# Patient Record
Sex: Female | Born: 1965 | Race: White | Hispanic: No | Smoking: Current every day smoker
Health system: Southern US, Community
[De-identification: ages and names within clinical notes are randomized; demographics above are authoritative.]

## PROBLEM LIST (undated history)

## (undated) DIAGNOSIS — I10 Essential (primary) hypertension: Secondary | ICD-10-CM

---

## 1999-02-27 ENCOUNTER — Encounter: Payer: Self-pay | Admitting: Internal Medicine

## 1999-02-27 ENCOUNTER — Ambulatory Visit (HOSPITAL_COMMUNITY): Admission: RE | Admit: 1999-02-27 | Discharge: 1999-02-27 | Payer: Self-pay | Admitting: Internal Medicine

## 1999-03-16 ENCOUNTER — Other Ambulatory Visit: Admission: RE | Admit: 1999-03-16 | Discharge: 1999-03-16 | Payer: Self-pay | Admitting: *Deleted

## 1999-06-15 ENCOUNTER — Other Ambulatory Visit: Admission: RE | Admit: 1999-06-15 | Discharge: 1999-06-15 | Payer: Self-pay | Admitting: *Deleted

## 1999-06-15 ENCOUNTER — Encounter (INDEPENDENT_AMBULATORY_CARE_PROVIDER_SITE_OTHER): Payer: Self-pay | Admitting: Specialist

## 1999-06-21 ENCOUNTER — Encounter: Admission: RE | Admit: 1999-06-21 | Discharge: 1999-06-21 | Payer: Self-pay | Admitting: Obstetrics and Gynecology

## 1999-06-21 ENCOUNTER — Encounter: Payer: Self-pay | Admitting: Obstetrics and Gynecology

## 1999-08-25 ENCOUNTER — Other Ambulatory Visit: Admission: RE | Admit: 1999-08-25 | Discharge: 1999-08-25 | Payer: Self-pay | Admitting: Obstetrics and Gynecology

## 1999-08-25 ENCOUNTER — Encounter (INDEPENDENT_AMBULATORY_CARE_PROVIDER_SITE_OTHER): Payer: Self-pay | Admitting: Specialist

## 2000-07-26 ENCOUNTER — Other Ambulatory Visit: Admission: RE | Admit: 2000-07-26 | Discharge: 2000-07-26 | Payer: Self-pay | Admitting: Family Medicine

## 2004-01-12 ENCOUNTER — Other Ambulatory Visit: Admission: RE | Admit: 2004-01-12 | Discharge: 2004-01-12 | Payer: Self-pay | Admitting: Family Medicine

## 2007-10-14 ENCOUNTER — Encounter: Admission: RE | Admit: 2007-10-14 | Discharge: 2007-10-14 | Payer: Self-pay | Admitting: Family Medicine

## 2010-03-24 ENCOUNTER — Other Ambulatory Visit: Payer: Self-pay | Admitting: Family Medicine

## 2010-03-24 DIAGNOSIS — Z1231 Encounter for screening mammogram for malignant neoplasm of breast: Secondary | ICD-10-CM

## 2010-03-29 ENCOUNTER — Ambulatory Visit: Payer: Self-pay

## 2010-04-05 ENCOUNTER — Ambulatory Visit
Admission: RE | Admit: 2010-04-05 | Discharge: 2010-04-05 | Disposition: A | Discharge status: Home / Self Care | Payer: BC Managed Care – PPO | Source: Ambulatory Visit | Attending: Family Medicine | Admitting: Family Medicine

## 2010-04-05 DIAGNOSIS — Z1231 Encounter for screening mammogram for malignant neoplasm of breast: Secondary | ICD-10-CM

## 2011-10-30 ENCOUNTER — Other Ambulatory Visit: Payer: Self-pay | Admitting: Family Medicine

## 2011-10-30 DIAGNOSIS — Z1231 Encounter for screening mammogram for malignant neoplasm of breast: Secondary | ICD-10-CM

## 2011-11-27 ENCOUNTER — Ambulatory Visit: Payer: BC Managed Care – PPO

## 2011-12-31 ENCOUNTER — Ambulatory Visit: Payer: BC Managed Care – PPO

## 2012-02-08 ENCOUNTER — Ambulatory Visit: Payer: BC Managed Care – PPO

## 2012-02-18 ENCOUNTER — Other Ambulatory Visit: Payer: Self-pay | Admitting: Family Medicine

## 2012-02-18 DIAGNOSIS — N921 Excessive and frequent menstruation with irregular cycle: Secondary | ICD-10-CM

## 2012-02-20 ENCOUNTER — Ambulatory Visit
Admission: RE | Admit: 2012-02-20 | Discharge: 2012-02-20 | Disposition: A | Discharge status: Home / Self Care | Payer: BC Managed Care – PPO | Source: Ambulatory Visit | Attending: Family Medicine | Admitting: Family Medicine

## 2012-02-20 DIAGNOSIS — N921 Excessive and frequent menstruation with irregular cycle: Secondary | ICD-10-CM

## 2012-05-14 ENCOUNTER — Ambulatory Visit: Payer: BC Managed Care – PPO

## 2012-05-21 ENCOUNTER — Ambulatory Visit: Payer: BC Managed Care – PPO

## 2013-04-21 ENCOUNTER — Other Ambulatory Visit: Payer: Self-pay | Admitting: Family Medicine

## 2013-04-21 DIAGNOSIS — Z1231 Encounter for screening mammogram for malignant neoplasm of breast: Secondary | ICD-10-CM

## 2013-05-01 ENCOUNTER — Ambulatory Visit
Admission: RE | Admit: 2013-05-01 | Discharge: 2013-05-01 | Disposition: A | Discharge status: Home / Self Care | Payer: BC Managed Care – PPO | Source: Ambulatory Visit | Attending: Family Medicine | Admitting: Family Medicine

## 2013-05-01 DIAGNOSIS — Z1231 Encounter for screening mammogram for malignant neoplasm of breast: Secondary | ICD-10-CM

## 2014-04-16 ENCOUNTER — Other Ambulatory Visit: Payer: Self-pay

## 2014-04-16 DIAGNOSIS — Z1231 Encounter for screening mammogram for malignant neoplasm of breast: Secondary | ICD-10-CM

## 2014-05-10 ENCOUNTER — Ambulatory Visit
Admission: RE | Admit: 2014-05-10 | Discharge: 2014-05-10 | Disposition: A | Discharge status: Home / Self Care | Payer: Self-pay | Source: Ambulatory Visit

## 2014-05-10 DIAGNOSIS — Z1231 Encounter for screening mammogram for malignant neoplasm of breast: Secondary | ICD-10-CM

## 2014-05-12 ENCOUNTER — Ambulatory Visit: Admission: RE | Admit: 2014-05-12 | Payer: BLUE CROSS/BLUE SHIELD | Source: Ambulatory Visit

## 2014-05-12 ENCOUNTER — Ambulatory Visit
Admission: RE | Admit: 2014-05-12 | Discharge: 2014-05-12 | Disposition: A | Discharge status: Home / Self Care | Payer: BLUE CROSS/BLUE SHIELD | Source: Ambulatory Visit

## 2014-05-12 ENCOUNTER — Other Ambulatory Visit: Payer: Self-pay

## 2014-05-12 DIAGNOSIS — Z1231 Encounter for screening mammogram for malignant neoplasm of breast: Secondary | ICD-10-CM

## 2015-05-20 ENCOUNTER — Other Ambulatory Visit: Payer: Self-pay | Admitting: Family Medicine

## 2015-05-20 DIAGNOSIS — Z1231 Encounter for screening mammogram for malignant neoplasm of breast: Secondary | ICD-10-CM

## 2015-05-20 DIAGNOSIS — E781 Pure hyperglyceridemia: Secondary | ICD-10-CM | POA: Diagnosis not present

## 2015-05-20 DIAGNOSIS — F17200 Nicotine dependence, unspecified, uncomplicated: Secondary | ICD-10-CM | POA: Diagnosis not present

## 2015-05-20 DIAGNOSIS — Z1389 Encounter for screening for other disorder: Secondary | ICD-10-CM | POA: Diagnosis not present

## 2015-05-20 DIAGNOSIS — E669 Obesity, unspecified: Secondary | ICD-10-CM | POA: Diagnosis not present

## 2015-05-20 DIAGNOSIS — Z0000 Encounter for general adult medical examination without abnormal findings: Secondary | ICD-10-CM | POA: Diagnosis not present

## 2015-05-20 DIAGNOSIS — Z79899 Other long term (current) drug therapy: Secondary | ICD-10-CM | POA: Diagnosis not present

## 2015-05-20 DIAGNOSIS — R5383 Other fatigue: Secondary | ICD-10-CM | POA: Diagnosis not present

## 2015-06-01 ENCOUNTER — Ambulatory Visit
Admission: RE | Admit: 2015-06-01 | Discharge: 2015-06-01 | Disposition: A | Discharge status: Home / Self Care | Payer: BLUE CROSS/BLUE SHIELD | Source: Ambulatory Visit | Attending: Family Medicine | Admitting: Family Medicine

## 2015-06-01 DIAGNOSIS — Z1231 Encounter for screening mammogram for malignant neoplasm of breast: Secondary | ICD-10-CM

## 2015-06-06 ENCOUNTER — Other Ambulatory Visit: Payer: Self-pay | Admitting: Family Medicine

## 2015-06-06 DIAGNOSIS — R928 Other abnormal and inconclusive findings on diagnostic imaging of breast: Secondary | ICD-10-CM

## 2015-06-10 ENCOUNTER — Ambulatory Visit
Admission: RE | Admit: 2015-06-10 | Discharge: 2015-06-10 | Disposition: A | Discharge status: Home / Self Care | Payer: BLUE CROSS/BLUE SHIELD | Source: Ambulatory Visit | Attending: Family Medicine | Admitting: Family Medicine

## 2015-06-10 DIAGNOSIS — R921 Mammographic calcification found on diagnostic imaging of breast: Secondary | ICD-10-CM | POA: Diagnosis not present

## 2015-06-10 DIAGNOSIS — R928 Other abnormal and inconclusive findings on diagnostic imaging of breast: Secondary | ICD-10-CM

## 2015-06-30 DIAGNOSIS — E669 Obesity, unspecified: Secondary | ICD-10-CM | POA: Diagnosis not present

## 2015-06-30 DIAGNOSIS — R21 Rash and other nonspecific skin eruption: Secondary | ICD-10-CM | POA: Diagnosis not present

## 2015-06-30 DIAGNOSIS — T781XXA Other adverse food reactions, not elsewhere classified, initial encounter: Secondary | ICD-10-CM | POA: Diagnosis not present

## 2015-06-30 DIAGNOSIS — Z6832 Body mass index (BMI) 32.0-32.9, adult: Secondary | ICD-10-CM | POA: Diagnosis not present

## 2015-11-21 DIAGNOSIS — F419 Anxiety disorder, unspecified: Secondary | ICD-10-CM | POA: Diagnosis not present

## 2015-11-21 DIAGNOSIS — Z23 Encounter for immunization: Secondary | ICD-10-CM | POA: Diagnosis not present

## 2015-11-21 DIAGNOSIS — K219 Gastro-esophageal reflux disease without esophagitis: Secondary | ICD-10-CM | POA: Diagnosis not present

## 2015-11-21 DIAGNOSIS — R419 Unspecified symptoms and signs involving cognitive functions and awareness: Secondary | ICD-10-CM | POA: Diagnosis not present

## 2015-11-21 DIAGNOSIS — I10 Essential (primary) hypertension: Secondary | ICD-10-CM | POA: Diagnosis not present

## 2015-11-21 DIAGNOSIS — J309 Allergic rhinitis, unspecified: Secondary | ICD-10-CM | POA: Diagnosis not present

## 2016-04-24 DIAGNOSIS — Z6831 Body mass index (BMI) 31.0-31.9, adult: Secondary | ICD-10-CM | POA: Diagnosis not present

## 2016-04-24 DIAGNOSIS — E669 Obesity, unspecified: Secondary | ICD-10-CM | POA: Diagnosis not present

## 2016-04-24 DIAGNOSIS — J019 Acute sinusitis, unspecified: Secondary | ICD-10-CM | POA: Diagnosis not present

## 2016-05-24 DIAGNOSIS — Z720 Tobacco use: Secondary | ICD-10-CM | POA: Diagnosis not present

## 2016-05-24 DIAGNOSIS — K219 Gastro-esophageal reflux disease without esophagitis: Secondary | ICD-10-CM | POA: Diagnosis not present

## 2016-05-24 DIAGNOSIS — E669 Obesity, unspecified: Secondary | ICD-10-CM | POA: Diagnosis not present

## 2016-05-24 DIAGNOSIS — F419 Anxiety disorder, unspecified: Secondary | ICD-10-CM | POA: Diagnosis not present

## 2016-05-24 DIAGNOSIS — Z1389 Encounter for screening for other disorder: Secondary | ICD-10-CM | POA: Diagnosis not present

## 2016-05-24 DIAGNOSIS — E781 Pure hyperglyceridemia: Secondary | ICD-10-CM | POA: Diagnosis not present

## 2016-05-24 DIAGNOSIS — I10 Essential (primary) hypertension: Secondary | ICD-10-CM | POA: Diagnosis not present

## 2016-07-17 DIAGNOSIS — Z6831 Body mass index (BMI) 31.0-31.9, adult: Secondary | ICD-10-CM | POA: Diagnosis not present

## 2016-07-17 DIAGNOSIS — J329 Chronic sinusitis, unspecified: Secondary | ICD-10-CM | POA: Diagnosis not present

## 2016-10-02 DIAGNOSIS — Z6831 Body mass index (BMI) 31.0-31.9, adult: Secondary | ICD-10-CM | POA: Diagnosis not present

## 2016-10-02 DIAGNOSIS — J019 Acute sinusitis, unspecified: Secondary | ICD-10-CM | POA: Diagnosis not present

## 2017-02-19 DIAGNOSIS — Z0000 Encounter for general adult medical examination without abnormal findings: Secondary | ICD-10-CM | POA: Diagnosis not present

## 2017-02-19 DIAGNOSIS — Z6832 Body mass index (BMI) 32.0-32.9, adult: Secondary | ICD-10-CM | POA: Diagnosis not present

## 2017-02-19 DIAGNOSIS — Z1231 Encounter for screening mammogram for malignant neoplasm of breast: Secondary | ICD-10-CM | POA: Diagnosis not present

## 2017-02-21 ENCOUNTER — Other Ambulatory Visit: Payer: Self-pay | Admitting: Nurse Practitioner

## 2017-02-21 DIAGNOSIS — R921 Mammographic calcification found on diagnostic imaging of breast: Secondary | ICD-10-CM

## 2017-03-13 ENCOUNTER — Ambulatory Visit
Admission: RE | Admit: 2017-03-13 | Discharge: 2017-03-13 | Disposition: A | Discharge status: Home / Self Care | Payer: BLUE CROSS/BLUE SHIELD | Source: Ambulatory Visit | Attending: Nurse Practitioner | Admitting: Nurse Practitioner

## 2017-03-13 DIAGNOSIS — R921 Mammographic calcification found on diagnostic imaging of breast: Secondary | ICD-10-CM

## 2017-05-22 ENCOUNTER — Other Ambulatory Visit: Payer: Self-pay

## 2017-05-22 ENCOUNTER — Emergency Department (HOSPITAL_BASED_OUTPATIENT_CLINIC_OR_DEPARTMENT_OTHER)
Admission: EM | Admit: 2017-05-22 | Discharge: 2017-05-22 | Disposition: A | Discharge status: Home / Self Care | Payer: BLUE CROSS/BLUE SHIELD | Attending: Emergency Medicine | Admitting: Emergency Medicine

## 2017-05-22 ENCOUNTER — Emergency Department (HOSPITAL_BASED_OUTPATIENT_CLINIC_OR_DEPARTMENT_OTHER): Payer: BLUE CROSS/BLUE SHIELD

## 2017-05-22 ENCOUNTER — Encounter (HOSPITAL_BASED_OUTPATIENT_CLINIC_OR_DEPARTMENT_OTHER): Payer: Self-pay | Admitting: Emergency Medicine

## 2017-05-22 DIAGNOSIS — Y92019 Unspecified place in single-family (private) house as the place of occurrence of the external cause: Secondary | ICD-10-CM | POA: Diagnosis not present

## 2017-05-22 DIAGNOSIS — S61411A Laceration without foreign body of right hand, initial encounter: Secondary | ICD-10-CM | POA: Insufficient documentation

## 2017-05-22 DIAGNOSIS — Z79899 Other long term (current) drug therapy: Secondary | ICD-10-CM | POA: Insufficient documentation

## 2017-05-22 DIAGNOSIS — I10 Essential (primary) hypertension: Secondary | ICD-10-CM | POA: Diagnosis not present

## 2017-05-22 DIAGNOSIS — S61212A Laceration without foreign body of right middle finger without damage to nail, initial encounter: Secondary | ICD-10-CM | POA: Diagnosis not present

## 2017-05-22 DIAGNOSIS — S61451A Open bite of right hand, initial encounter: Secondary | ICD-10-CM | POA: Diagnosis not present

## 2017-05-22 DIAGNOSIS — Y999 Unspecified external cause status: Secondary | ICD-10-CM | POA: Insufficient documentation

## 2017-05-22 DIAGNOSIS — W540XXA Bitten by dog, initial encounter: Secondary | ICD-10-CM | POA: Insufficient documentation

## 2017-05-22 DIAGNOSIS — Y939 Activity, unspecified: Secondary | ICD-10-CM | POA: Diagnosis not present

## 2017-05-22 DIAGNOSIS — F17200 Nicotine dependence, unspecified, uncomplicated: Secondary | ICD-10-CM | POA: Diagnosis not present

## 2017-05-22 DIAGNOSIS — S6991XA Unspecified injury of right wrist, hand and finger(s), initial encounter: Secondary | ICD-10-CM | POA: Diagnosis not present

## 2017-05-22 DIAGNOSIS — S61214A Laceration without foreign body of right ring finger without damage to nail, initial encounter: Secondary | ICD-10-CM | POA: Diagnosis not present

## 2017-05-22 HISTORY — DX: Essential (primary) hypertension: I10

## 2017-05-22 MED ORDER — LIDOCAINE HCL 2 % IJ SOLN
20.0000 mL | Freq: Once | INTRAMUSCULAR | Status: AC
  Administered 2017-05-22: 400 mg via INTRADERMAL
  Filled 2017-05-22: qty 20

## 2017-05-22 MED ORDER — TETANUS-DIPHTH-ACELL PERTUSSIS 5-2.5-18.5 LF-MCG/0.5 IM SUSP
0.5000 mL | Freq: Once | INTRAMUSCULAR | Status: AC
  Administered 2017-05-22: 0.5 mL via INTRAMUSCULAR
  Filled 2017-05-22: qty 0.5

## 2017-05-22 MED ORDER — AMOXICILLIN-POT CLAVULANATE 875-125 MG PO TABS
1.0000 | ORAL_TABLET | Freq: Once | ORAL | Status: AC
  Administered 2017-05-22: 1 via ORAL
  Filled 2017-05-22: qty 1

## 2017-05-22 MED ORDER — AMOXICILLIN-POT CLAVULANATE 875-125 MG PO TABS: 1.0000 | ORAL_TABLET | Freq: Two times a day (BID) | ORAL | 0 refills | Status: AC

## 2017-05-22 MED FILL — AMOX-CLAV 875-125 MG TABLET: 875-125 | 5 days supply | Qty: 10 | Fill #0

## 2017-05-22 NOTE — ED Notes (Signed)
ED Provider at bedside. 

## 2017-05-22 NOTE — Discharge Instructions (Signed)
Keep area clean and covered Take Augmentin twice daily with food for 5 days Return if you have worsening pain, swelling, drainage from the area

## 2017-05-22 NOTE — ED Provider Notes (Signed)
[facility] EMERGENCY DEPARTMENT Provider Note   CSN: [id number] Arrival date & time: 05/22/17  1003     History   Chief Complaint Chief Complaint  Patient presents with  . Animal Bite    HPI Heidi Wright is a 52 y.o. female who presents with right hand laceration.  Past medical history significant for hypertension.  She states that she was breaking up a fight between 2 of her dogs and they bit her right hand.  She sustained a puncture wound on the dorsal aspect of her hand and an abrasion on her finger.  It occurred at about 4 AM this morning.  She has full range of motion of her finger but to to swelling it is mildly decreased.  She did clean out the wound with soap and water.  She is not up-to-date on tetanus.  Her dogs are up-to-date on rabies vaccination.  HPI  Past Medical History:  Diagnosis Date  . Hypertension     There are no active problems to display for this patient.   History reviewed. No pertinent surgical history.   OB History   None      Home Medications    Prior to Admission medications   Medication Sig Start Date End Date Taking? Authorizing Provider  amLODipine (NORVASC) 10 MG tablet Take 10 mg by mouth daily.   Yes [provider]  busPIRone (BUSPAR) 10 MG tablet Take 10 mg by mouth 3 (three) times daily.   Yes [provider]  doxazosin (CARDURA) 2 MG tablet Take 2 mg by mouth daily.   Yes [provider]  DULoxetine (CYMBALTA) 20 MG capsule Take 20 mg by mouth daily.   Yes [provider]  omeprazole (PRILOSEC) 20 MG capsule Take 20 mg by mouth daily.   Yes [provider]  amoxicillin-clavulanate (AUGMENTIN) 875-125 MG tablet Take 1 tablet by mouth 2 (two) times daily. 05/22/17   Bethel Born, PA-C    Family History No family history on file.  Social History Social History   Tobacco Use  . Smoking status: Current Every Day Smoker  . Smokeless tobacco: Never Used  Substance  Use Topics  . Alcohol use: Not on file  . Drug use: Not on file     Allergies   Sulfa antibiotics   Review of Systems Review of Systems  Musculoskeletal: Positive for arthralgias.  Skin: Positive for wound.  Neurological: Negative for weakness and numbness.  All other systems reviewed and are negative.    Physical Exam Updated Vital Signs BP (!) 153/90 (BP Location: Right Arm)   Pulse 84   Temp 98.9 F (37.2 C) (Oral)   Resp 16   Ht  (1.6 m)   Wt 77.1 kg (170 lb)   SpO2 99%   BMI 30.11 kg/m   Physical Exam  Constitutional: She is oriented to person, place, and time. She appears well-developed and well-nourished. No distress.  Calm and cooperative  HENT:  Head: Normocephalic and atraumatic.  Eyes: Pupils are equal, round, and reactive to light. Conjunctivae are normal. Right eye exhibits no discharge. Left eye exhibits no discharge. No scleral icterus.  Neck: Normal range of motion.  Cardiovascular: Normal rate.  Pulmonary/Chest: Effort normal. No respiratory distress.  Abdominal: She exhibits no distension.  Musculoskeletal:  Right hand: Mild swelling over dorsal aspect of hand. Laceration between the 2nd and 3rd finger web space with tenderness. No obvious tendon laceration of bone. Decreased ROM. N/V intact.  Neurological: She is alert and oriented to person, place, and time.  Skin: Skin is warm and dry.  Psychiatric: She has a normal mood and affect. Her behavior is normal.  Nursing note and vitals reviewed.    ED Treatments / Results  Labs (all labs ordered are listed, but only abnormal results are displayed) Labs Reviewed - No data to display  EKG None  Radiology Dg Hand Complete Right  Result Date: 05/22/2017 CLINICAL DATA:  Deep laceration.  Dog bite. EXAM: RIGHT HAND - COMPLETE 3+ VIEW COMPARISON:  None. FINDINGS: There is no evidence of fracture or dislocation. There is no evidence of arthropathy or other focal bone abnormality. Mild soft  tissue swelling. IMPRESSION: Negative for fracture or dislocation. Electronically Signed   By: Elsie Stain M.D.   On: 05/22/2017 14:37    Procedures Procedures (including critical care time)  Medications Ordered in ED Medications  Tdap (BOOSTRIX) injection 0.5 mL (has no administration in time range)  amoxicillin-clavulanate (AUGMENTIN) 875-125 MG per tablet 1 tablet (has no administration in time range)  lidocaine (XYLOCAINE) 2 % (with pres) injection 400 mg (400 mg Intradermal Given 05/22/17 1415)     Initial Impression / Assessment and Plan / ED Course  I have reviewed the triage vital signs and the nursing notes.  Pertinent labs & imaging results that were available during my care of the patient were reviewed by me and considered in my medical decision making (see chart for details).  52 year old female with puncture wound to the right hand after trying to break up a dog fight.  She is mildly hypertensive otherwise vital signs are normal.  Tetanus was updated.  Discussed with her that we will let the wound to heal by secondary intention since she is at high risk for getting an infection in the area.  X-ray was negative.  The wound was copiously irrigated and she was given strict return precautions.  She is also given a prescription for Augmentin for 5 days.  Final Clinical Impressions(s) / ED Diagnoses   Final diagnoses:  Dog bite of right hand, initial encounter    ED Discharge Orders        Ordered    amoxicillin-clavulanate (AUGMENTIN) 875-125 MG tablet  2 times daily     05/22/17 1515       Bethel Born, PA-C 05/22/17 1522    Loren Racer, MD 05/23/17 [phone]

## 2017-05-22 NOTE — ED Triage Notes (Signed)
Lacerations to R hand from breaking up a fight between her two dogs. Vaccines are up to date.

## 2017-07-02 DIAGNOSIS — M545 Low back pain: Secondary | ICD-10-CM | POA: Diagnosis not present

## 2017-07-02 DIAGNOSIS — Z6832 Body mass index (BMI) 32.0-32.9, adult: Secondary | ICD-10-CM | POA: Diagnosis not present

## 2017-11-08 DIAGNOSIS — F419 Anxiety disorder, unspecified: Secondary | ICD-10-CM | POA: Diagnosis not present

## 2017-11-08 DIAGNOSIS — Z720 Tobacco use: Secondary | ICD-10-CM | POA: Diagnosis not present

## 2017-11-08 DIAGNOSIS — I10 Essential (primary) hypertension: Secondary | ICD-10-CM | POA: Diagnosis not present

## 2017-11-08 DIAGNOSIS — E781 Pure hyperglyceridemia: Secondary | ICD-10-CM | POA: Diagnosis not present

## 2017-11-08 DIAGNOSIS — E669 Obesity, unspecified: Secondary | ICD-10-CM | POA: Diagnosis not present

## 2017-11-08 DIAGNOSIS — Z23 Encounter for immunization: Secondary | ICD-10-CM | POA: Diagnosis not present

## 2017-11-08 DIAGNOSIS — K219 Gastro-esophageal reflux disease without esophagitis: Secondary | ICD-10-CM | POA: Diagnosis not present

## 2017-11-18 DIAGNOSIS — J329 Chronic sinusitis, unspecified: Secondary | ICD-10-CM | POA: Diagnosis not present

## 2018-02-10 DIAGNOSIS — J069 Acute upper respiratory infection, unspecified: Secondary | ICD-10-CM | POA: Diagnosis not present

## 2018-02-10 DIAGNOSIS — Z6832 Body mass index (BMI) 32.0-32.9, adult: Secondary | ICD-10-CM | POA: Diagnosis not present

## 2018-03-13 DIAGNOSIS — R05 Cough: Secondary | ICD-10-CM | POA: Diagnosis not present

## 2018-03-13 DIAGNOSIS — J00 Acute nasopharyngitis [common cold]: Secondary | ICD-10-CM | POA: Diagnosis not present

## 2018-03-13 DIAGNOSIS — R0981 Nasal congestion: Secondary | ICD-10-CM | POA: Diagnosis not present

## 2018-05-19 DIAGNOSIS — J0100 Acute maxillary sinusitis, unspecified: Secondary | ICD-10-CM | POA: Diagnosis not present

## 2018-05-19 DIAGNOSIS — Z6831 Body mass index (BMI) 31.0-31.9, adult: Secondary | ICD-10-CM | POA: Diagnosis not present

## 2018-09-01 DIAGNOSIS — E669 Obesity, unspecified: Secondary | ICD-10-CM | POA: Diagnosis not present

## 2018-09-01 DIAGNOSIS — Z720 Tobacco use: Secondary | ICD-10-CM | POA: Diagnosis not present

## 2018-09-01 DIAGNOSIS — E781 Pure hyperglyceridemia: Secondary | ICD-10-CM | POA: Diagnosis not present

## 2018-09-01 DIAGNOSIS — I10 Essential (primary) hypertension: Secondary | ICD-10-CM | POA: Diagnosis not present

## 2018-09-01 DIAGNOSIS — K219 Gastro-esophageal reflux disease without esophagitis: Secondary | ICD-10-CM | POA: Diagnosis not present

## 2018-09-01 DIAGNOSIS — F419 Anxiety disorder, unspecified: Secondary | ICD-10-CM | POA: Diagnosis not present

## 2018-10-20 DIAGNOSIS — A084 Viral intestinal infection, unspecified: Secondary | ICD-10-CM | POA: Diagnosis not present

## 2018-12-09 DIAGNOSIS — Z6831 Body mass index (BMI) 31.0-31.9, adult: Secondary | ICD-10-CM | POA: Diagnosis not present

## 2018-12-09 DIAGNOSIS — K5900 Constipation, unspecified: Secondary | ICD-10-CM | POA: Diagnosis not present

## 2018-12-09 DIAGNOSIS — R109 Unspecified abdominal pain: Secondary | ICD-10-CM | POA: Diagnosis not present

## 2018-12-09 DIAGNOSIS — R140 Abdominal distension (gaseous): Secondary | ICD-10-CM | POA: Diagnosis not present

## 2019-02-23 DIAGNOSIS — R430 Anosmia: Secondary | ICD-10-CM | POA: Diagnosis not present

## 2019-02-23 DIAGNOSIS — J3489 Other specified disorders of nose and nasal sinuses: Secondary | ICD-10-CM | POA: Diagnosis not present

## 2019-02-23 DIAGNOSIS — Z20828 Contact with and (suspected) exposure to other viral communicable diseases: Secondary | ICD-10-CM | POA: Diagnosis not present

## 2019-02-23 DIAGNOSIS — R438 Other disturbances of smell and taste: Secondary | ICD-10-CM | POA: Diagnosis not present

## 2019-04-01 DIAGNOSIS — Z2821 Immunization not carried out because of patient refusal: Secondary | ICD-10-CM | POA: Diagnosis not present

## 2019-04-01 DIAGNOSIS — Z1231 Encounter for screening mammogram for malignant neoplasm of breast: Secondary | ICD-10-CM | POA: Diagnosis not present

## 2019-04-01 DIAGNOSIS — Z0000 Encounter for general adult medical examination without abnormal findings: Secondary | ICD-10-CM | POA: Diagnosis not present

## 2019-04-01 DIAGNOSIS — Z13220 Encounter for screening for lipoid disorders: Secondary | ICD-10-CM | POA: Diagnosis not present

## 2019-04-01 DIAGNOSIS — Z6829 Body mass index (BMI) 29.0-29.9, adult: Secondary | ICD-10-CM | POA: Diagnosis not present

## 2019-04-02 ENCOUNTER — Other Ambulatory Visit: Payer: Self-pay | Admitting: Nurse Practitioner

## 2019-04-02 DIAGNOSIS — Z1231 Encounter for screening mammogram for malignant neoplasm of breast: Secondary | ICD-10-CM

## 2019-04-20 ENCOUNTER — Other Ambulatory Visit: Payer: Self-pay

## 2019-04-20 ENCOUNTER — Ambulatory Visit
Admission: RE | Admit: 2019-04-20 | Discharge: 2019-04-20 | Disposition: A | Discharge status: Home / Self Care | Payer: BLUE CROSS/BLUE SHIELD | Source: Ambulatory Visit | Attending: Nurse Practitioner | Admitting: Nurse Practitioner

## 2019-04-20 DIAGNOSIS — Z1231 Encounter for screening mammogram for malignant neoplasm of breast: Secondary | ICD-10-CM | POA: Diagnosis not present

## 2019-06-17 DIAGNOSIS — M9901 Segmental and somatic dysfunction of cervical region: Secondary | ICD-10-CM | POA: Diagnosis not present

## 2019-06-17 DIAGNOSIS — M9903 Segmental and somatic dysfunction of lumbar region: Secondary | ICD-10-CM | POA: Diagnosis not present

## 2019-06-17 DIAGNOSIS — M545 Low back pain: Secondary | ICD-10-CM | POA: Diagnosis not present

## 2019-06-17 DIAGNOSIS — M62830 Muscle spasm of back: Secondary | ICD-10-CM | POA: Diagnosis not present

## 2019-06-23 DIAGNOSIS — M9901 Segmental and somatic dysfunction of cervical region: Secondary | ICD-10-CM | POA: Diagnosis not present

## 2019-06-23 DIAGNOSIS — M545 Low back pain: Secondary | ICD-10-CM | POA: Diagnosis not present

## 2019-06-23 DIAGNOSIS — M9903 Segmental and somatic dysfunction of lumbar region: Secondary | ICD-10-CM | POA: Diagnosis not present

## 2019-06-23 DIAGNOSIS — M62830 Muscle spasm of back: Secondary | ICD-10-CM | POA: Diagnosis not present

## 2019-08-17 DIAGNOSIS — Z20822 Contact with and (suspected) exposure to covid-19: Secondary | ICD-10-CM | POA: Diagnosis not present

## 2019-08-21 DIAGNOSIS — Z20822 Contact with and (suspected) exposure to covid-19: Secondary | ICD-10-CM | POA: Diagnosis not present

## 2019-08-24 DIAGNOSIS — Z20822 Contact with and (suspected) exposure to covid-19: Secondary | ICD-10-CM | POA: Diagnosis not present

## 2019-08-31 DIAGNOSIS — Z20822 Contact with and (suspected) exposure to covid-19: Secondary | ICD-10-CM | POA: Diagnosis not present

## 2019-09-14 DIAGNOSIS — Z20822 Contact with and (suspected) exposure to covid-19: Secondary | ICD-10-CM | POA: Diagnosis not present

## 2019-09-28 DIAGNOSIS — Z1152 Encounter for screening for COVID-19: Secondary | ICD-10-CM | POA: Diagnosis not present

## 2019-10-10 DIAGNOSIS — Z6829 Body mass index (BMI) 29.0-29.9, adult: Secondary | ICD-10-CM | POA: Diagnosis not present

## 2019-10-10 DIAGNOSIS — M545 Low back pain: Secondary | ICD-10-CM | POA: Diagnosis not present

## 2019-10-30 DIAGNOSIS — R140 Abdominal distension (gaseous): Secondary | ICD-10-CM | POA: Diagnosis not present

## 2019-10-30 DIAGNOSIS — I10 Essential (primary) hypertension: Secondary | ICD-10-CM | POA: Diagnosis not present

## 2019-10-30 DIAGNOSIS — K219 Gastro-esophageal reflux disease without esophagitis: Secondary | ICD-10-CM | POA: Diagnosis not present

## 2019-10-30 DIAGNOSIS — Z720 Tobacco use: Secondary | ICD-10-CM | POA: Diagnosis not present

## 2019-10-30 DIAGNOSIS — F419 Anxiety disorder, unspecified: Secondary | ICD-10-CM | POA: Diagnosis not present

## 2019-10-30 DIAGNOSIS — Z1331 Encounter for screening for depression: Secondary | ICD-10-CM | POA: Diagnosis not present

## 2019-10-30 DIAGNOSIS — E669 Obesity, unspecified: Secondary | ICD-10-CM | POA: Diagnosis not present

## 2019-11-23 DIAGNOSIS — F17210 Nicotine dependence, cigarettes, uncomplicated: Secondary | ICD-10-CM | POA: Diagnosis not present

## 2019-11-23 DIAGNOSIS — J209 Acute bronchitis, unspecified: Secondary | ICD-10-CM | POA: Diagnosis not present

## 2019-11-23 DIAGNOSIS — J019 Acute sinusitis, unspecified: Secondary | ICD-10-CM | POA: Diagnosis not present

## 2020-03-03 ENCOUNTER — Other Ambulatory Visit: Payer: Self-pay | Admitting: Physician Assistant

## 2020-03-03 DIAGNOSIS — R1084 Generalized abdominal pain: Secondary | ICD-10-CM

## 2020-03-21 ENCOUNTER — Ambulatory Visit
Admission: RE | Admit: 2020-03-21 | Discharge: 2020-03-21 | Disposition: A | Discharge status: Home / Self Care | Payer: BC Managed Care – PPO | Source: Ambulatory Visit | Attending: Physician Assistant | Admitting: Physician Assistant

## 2020-03-21 ENCOUNTER — Other Ambulatory Visit: Payer: Self-pay

## 2020-03-21 DIAGNOSIS — R1084 Generalized abdominal pain: Secondary | ICD-10-CM

## 2020-03-21 MED ORDER — IOPAMIDOL (ISOVUE-300) INJECTION 61%
100.0000 mL | Freq: Once | INTRAVENOUS | Status: AC | PRN
  Administered 2020-03-21: 100 mL via INTRAVENOUS

## 2020-11-03 ENCOUNTER — Other Ambulatory Visit: Payer: Self-pay | Admitting: Physician Assistant

## 2020-11-03 DIAGNOSIS — Z1231 Encounter for screening mammogram for malignant neoplasm of breast: Secondary | ICD-10-CM

## 2020-12-20 ENCOUNTER — Ambulatory Visit: Payer: BC Managed Care – PPO

## 2020-12-27 ENCOUNTER — Ambulatory Visit
Admission: RE | Admit: 2020-12-27 | Discharge: 2020-12-27 | Disposition: A | Discharge status: Home / Self Care | Payer: BC Managed Care – PPO | Source: Ambulatory Visit | Attending: Physician Assistant | Admitting: Physician Assistant

## 2020-12-27 DIAGNOSIS — Z1231 Encounter for screening mammogram for malignant neoplasm of breast: Secondary | ICD-10-CM

## 2021-03-15 IMAGING — CT CT ABD-PELV W/ CM
1 of 3 series · 14 of 32 positions shown, 19 images · IV contrast (iopamidol)
Comparison: None.

CLINICAL DATA: Intermittent generalized abdominal pain for several
years.

EXAM:
CT ABDOMEN AND PELVIS WITH CONTRAST
TECHNIQUE: Multidetector CT imaging of the abdomen and pelvis was performed
using the standard protocol following bolus administration of
intravenous contrast.
CONTRAST:  100mL [id number] IOPAMIDOL ([id number]) INJECTION 61%

[Series 2: abd/pelvis w/cm · axial · 0.72mm/px · z∈[-449,-34]mm · 14 of 95 slices shown, 19 images]
[im 6/95  soft-tissue]
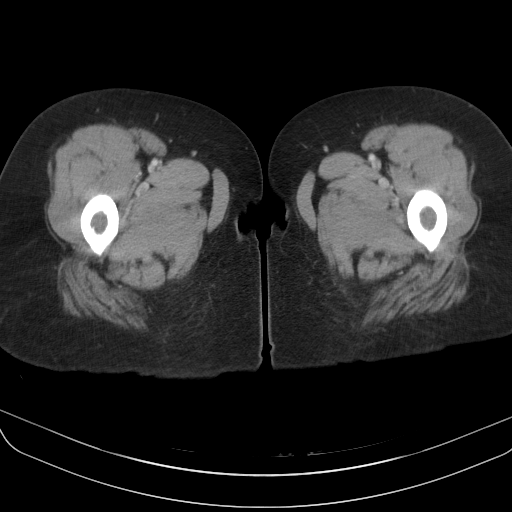
[im 6/95  bone]
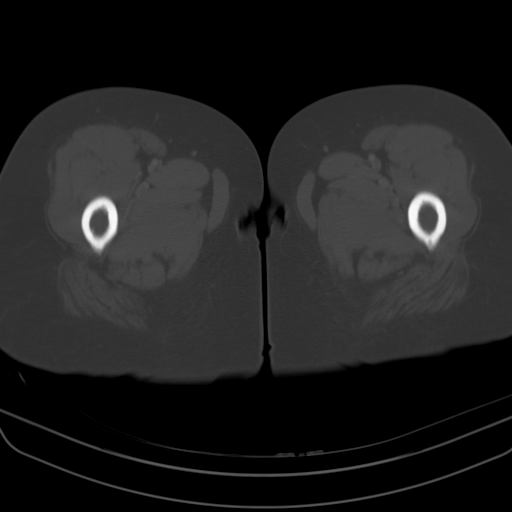
[im 12/95  soft-tissue]
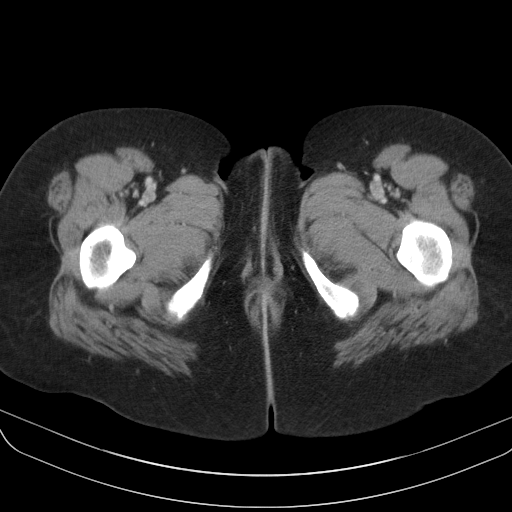
[im 23/95  soft-tissue]
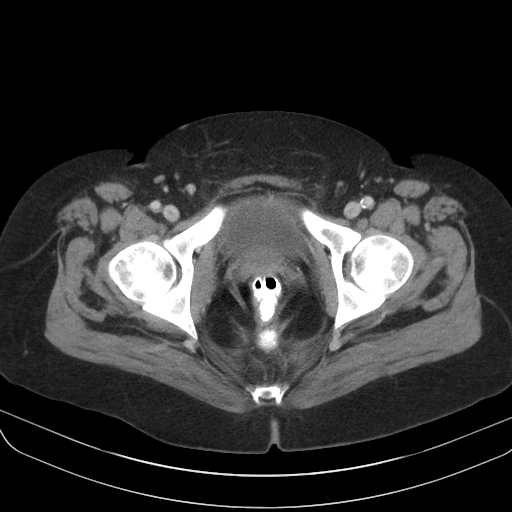
[im 28/95  soft-tissue]
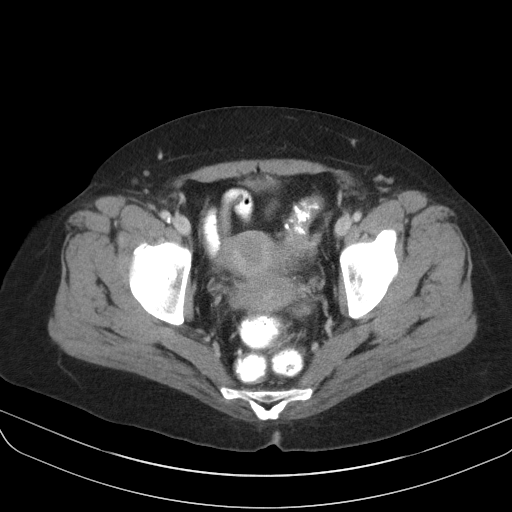
[im 34/95  soft-tissue]
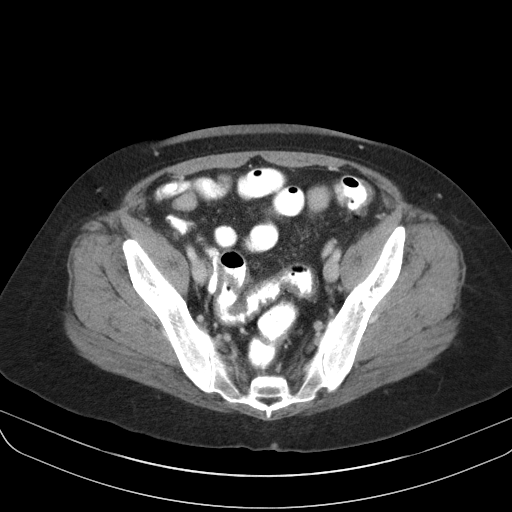
[im 39/95  soft-tissue]
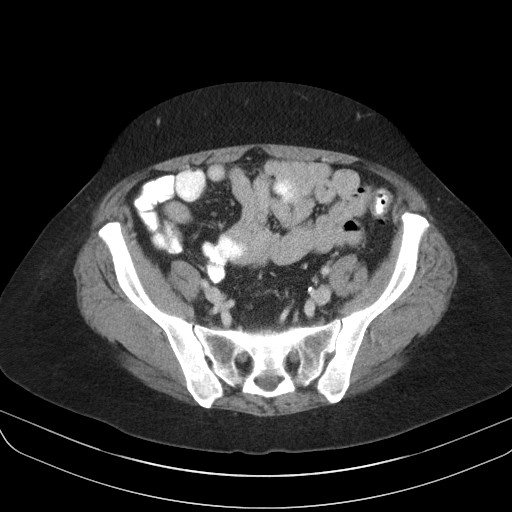
[im 50/95  soft-tissue]
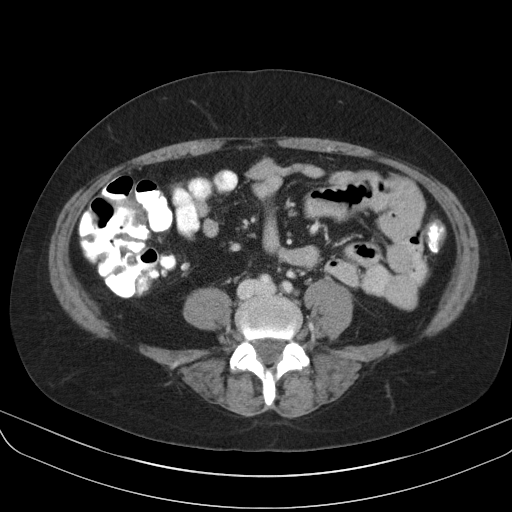
[im 56/95  soft-tissue]
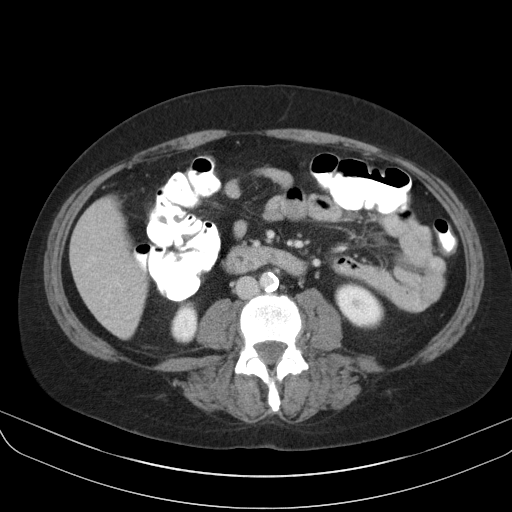
[im 61/95  soft-tissue]
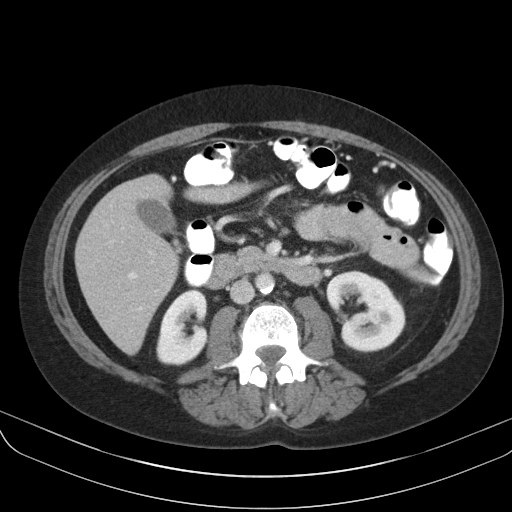
[im 61/95  bone]
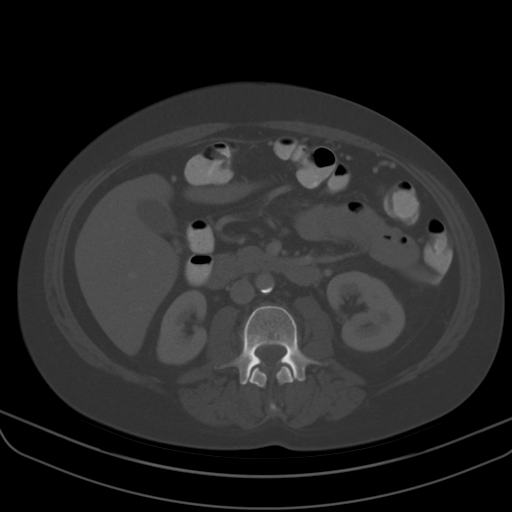
[im 67/95  soft-tissue]
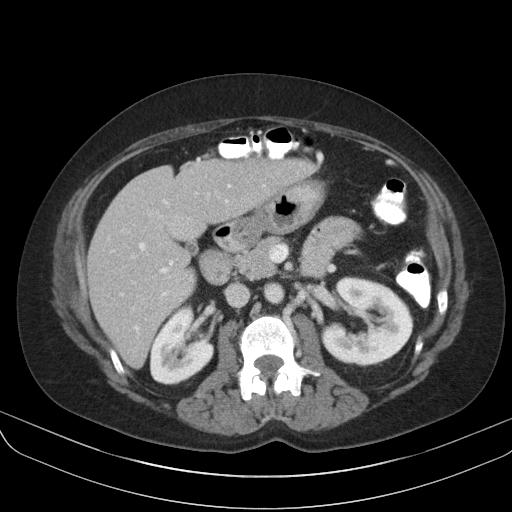
[im 72/95  soft-tissue]
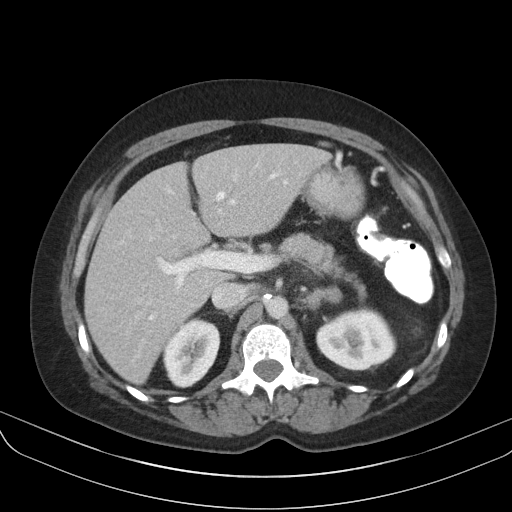
[im 72/95  lung]
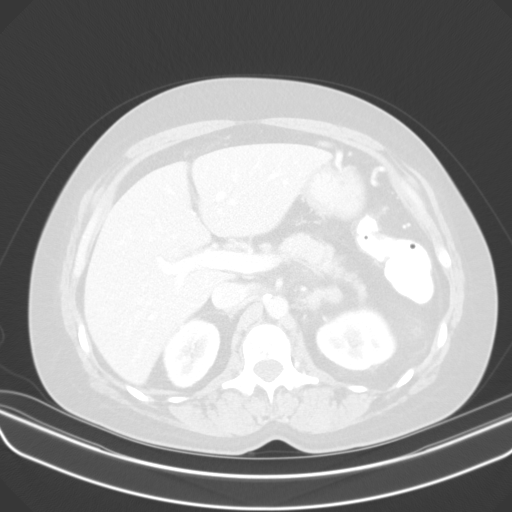
[im 78/95  lung]
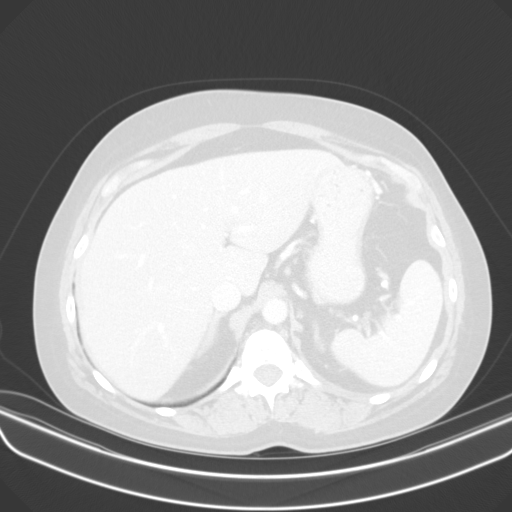
[im 83/95  soft-tissue]
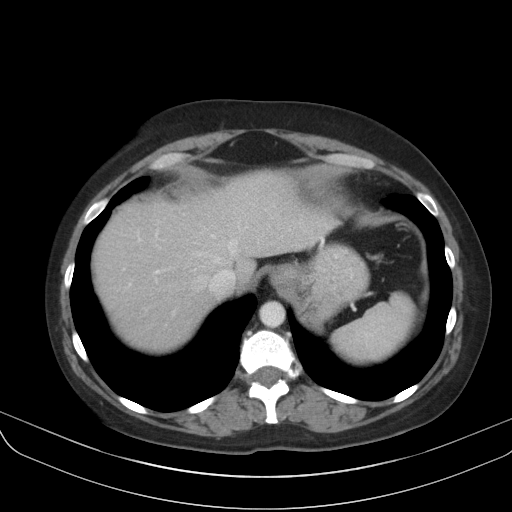
[im 83/95  lung]
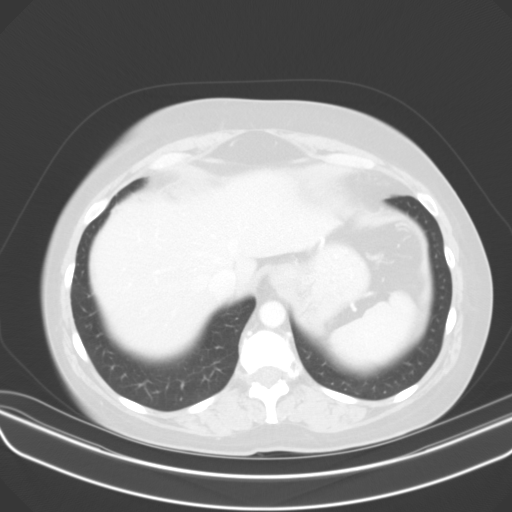
[im 89/95  soft-tissue]
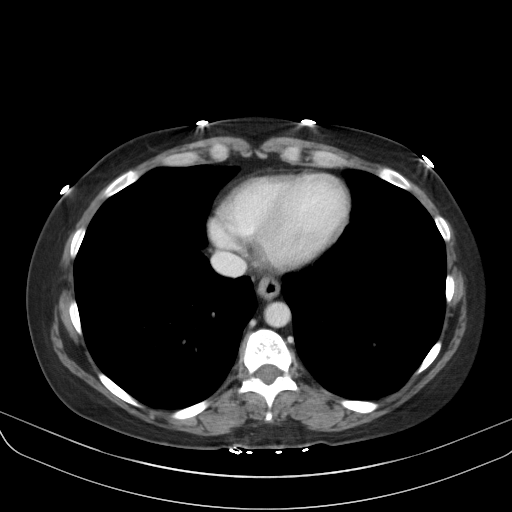
[im 89/95  lung]
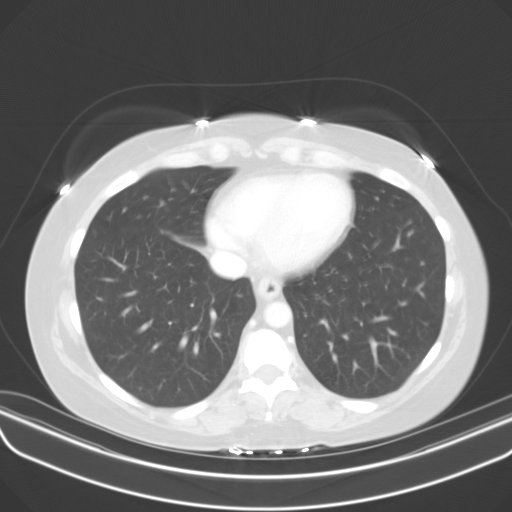

[14 of 32 positions shown; findings below may reference images not displayed]

FINDINGS: Lower Chest: No acute findings.

Hepatobiliary: No hepatic masses identified. Gallbladder is
unremarkable. No evidence of biliary ductal dilatation.

Pancreas:  No mass or inflammatory changes.

Spleen: Within normal limits in size and appearance.

Adrenals/Urinary Tract: No masses identified. No evidence of
ureteral calculi or hydronephrosis.

Stomach/Bowel: No evidence of obstruction, inflammatory process or
abnormal fluid collections. Normal appendix visualized.
Diverticulosis is seen involving the ascending, descending, and
sigmoid colon, without evidence of diverticulitis.

Vascular/Lymphatic: No pathologically enlarged lymph nodes. Splenic
vein is absent, with portosystemic venous collaterals seen in the
gastrosplenic and gastrohepatic ligaments and omentum. Portal and
hepatic veins are patent. No abdominal aortic aneurysm. Aortic
atherosclerotic calcification noted.

Reproductive:  No mass or other significant abnormality.

Other:  None.

Musculoskeletal:  No suspicious bone lesions identified.
IMPRESSION: No acute findings.

Absent splenic vein with portosystemic venous collaterals.

Colonic diverticulosis. No radiographic evidence of diverticulitis.

Aortic Atherosclerosis ([id number]).

## 2021-11-02 ENCOUNTER — Other Ambulatory Visit: Payer: Self-pay | Admitting: Family Medicine

## 2021-11-02 DIAGNOSIS — F17210 Nicotine dependence, cigarettes, uncomplicated: Secondary | ICD-10-CM

## 2022-01-17 ENCOUNTER — Encounter: Payer: Self-pay | Admitting: Family Medicine

## 2022-01-18 ENCOUNTER — Ambulatory Visit
Admission: RE | Admit: 2022-01-18 | Discharge: 2022-01-18 | Disposition: A | Discharge status: Home / Self Care | Payer: BC Managed Care – PPO | Source: Ambulatory Visit | Attending: Family Medicine | Admitting: Family Medicine

## 2022-01-18 DIAGNOSIS — F17210 Nicotine dependence, cigarettes, uncomplicated: Secondary | ICD-10-CM

## 2022-10-25 ENCOUNTER — Other Ambulatory Visit: Payer: Self-pay | Admitting: Physician Assistant

## 2022-10-25 DIAGNOSIS — Z1231 Encounter for screening mammogram for malignant neoplasm of breast: Secondary | ICD-10-CM

## 2022-12-06 ENCOUNTER — Ambulatory Visit: Payer: BC Managed Care – PPO

## 2023-01-21 ENCOUNTER — Ambulatory Visit
Admission: RE | Admit: 2023-01-21 | Discharge: 2023-01-21 | Disposition: A | Discharge status: Home / Self Care | Payer: BC Managed Care – PPO | Source: Ambulatory Visit | Attending: Physician Assistant | Admitting: Physician Assistant

## 2023-01-21 DIAGNOSIS — Z1231 Encounter for screening mammogram for malignant neoplasm of breast: Secondary | ICD-10-CM

## 2023-01-24 ENCOUNTER — Other Ambulatory Visit: Payer: Self-pay | Admitting: Physician Assistant

## 2023-01-24 DIAGNOSIS — F17210 Nicotine dependence, cigarettes, uncomplicated: Secondary | ICD-10-CM

## 2023-01-25 ENCOUNTER — Encounter: Payer: Self-pay | Admitting: Physician Assistant

## 2023-02-01 ENCOUNTER — Ambulatory Visit
Admission: RE | Admit: 2023-02-01 | Discharge: 2023-02-01 | Disposition: A | Discharge status: Home / Self Care | Payer: BC Managed Care – PPO | Source: Ambulatory Visit | Attending: Physician Assistant | Admitting: Physician Assistant

## 2023-02-01 DIAGNOSIS — F17210 Nicotine dependence, cigarettes, uncomplicated: Secondary | ICD-10-CM
